# Patient Record
Sex: Female | Born: 1990 | Race: White | Hispanic: No | Marital: Single | Smoking: Never smoker
Health system: Southern US, Community
[De-identification: ages and names within clinical notes are randomized; demographics above are authoritative.]

## PROBLEM LIST (undated history)

## (undated) DIAGNOSIS — N920 Excessive and frequent menstruation with regular cycle: Secondary | ICD-10-CM

## (undated) DIAGNOSIS — J45909 Unspecified asthma, uncomplicated: Secondary | ICD-10-CM

## (undated) DIAGNOSIS — N200 Calculus of kidney: Secondary | ICD-10-CM

## (undated) HISTORY — DX: Excessive and frequent menstruation with regular cycle: N92.0

## (undated) HISTORY — PX: FRACTURE SURGERY: SHX138

## (undated) HISTORY — DX: Unspecified asthma, uncomplicated: J45.909

## (undated) HISTORY — DX: Calculus of kidney: N20.0

---

## 2016-10-21 ENCOUNTER — Other Ambulatory Visit: Payer: Self-pay | Admitting: Obstetrics and Gynecology

## 2016-10-21 DIAGNOSIS — Z309 Encounter for contraceptive management, unspecified: Secondary | ICD-10-CM

## 2016-10-29 ENCOUNTER — Ambulatory Visit: Payer: BC Managed Care – PPO | Admitting: Obstetrics and Gynecology

## 2016-12-12 ENCOUNTER — Ambulatory Visit: Payer: BC Managed Care – PPO | Admitting: Obstetrics and Gynecology

## 2017-01-10 ENCOUNTER — Ambulatory Visit (INDEPENDENT_AMBULATORY_CARE_PROVIDER_SITE_OTHER): Payer: BC Managed Care – PPO

## 2017-01-10 ENCOUNTER — Encounter: Payer: Self-pay | Admitting: Gynecology

## 2017-01-10 ENCOUNTER — Ambulatory Visit
Admission: EM | Admit: 2017-01-10 | Discharge: 2017-01-10 | Disposition: A | Discharge status: Home / Self Care | Payer: BC Managed Care – PPO | Attending: Internal Medicine | Admitting: Internal Medicine

## 2017-01-10 DIAGNOSIS — S93602A Unspecified sprain of left foot, initial encounter: Secondary | ICD-10-CM

## 2017-01-10 DIAGNOSIS — R030 Elevated blood-pressure reading, without diagnosis of hypertension: Secondary | ICD-10-CM | POA: Diagnosis not present

## 2017-01-10 DIAGNOSIS — M79672 Pain in left foot: Secondary | ICD-10-CM

## 2017-01-10 MED ORDER — ACETAMINOPHEN 325 MG PO TABS
650.0000 mg | ORAL_TABLET | Freq: Once | ORAL | Status: AC
  Administered 2017-01-10: 650 mg via ORAL

## 2017-01-10 NOTE — ED Provider Notes (Signed)
CSN: [id number]     Arrival date & time 01/10/17  1605 History   None    Chief Complaint  Patient presents with  . Foot Pain   (Consider location/radiation/quality/duration/timing/severity/associated sxs/prior Treatment) 26 yr old obese caucasian female presents to UC cc of left foot pain, states no known injury however did "stepped wrong on stairs last night". Tried ibuprofen this am, hurts to bear wt. On feet a lot doing testing at school.    The history is provided by the patient. No language interpreter was used.    Past Medical History:  Diagnosis Date  . Asthma   . Kidney stone   . Menorrhagia    History reviewed. No pertinent surgical history. Family History  Problem Relation Age of Onset  . Melanoma Father    Social History  Substance Use Topics  . Smoking status: Never Smoker  . Smokeless tobacco: Never Used  . Alcohol use Yes   OB History    Gravida Para Term Preterm AB Living   0 0 0 0 0 0   SAB TAB Ectopic Multiple Live Births   0 0 0 0 0     Review of Systems  Constitutional: Positive for activity change. Negative for fever.  HENT: Negative.   Eyes: Negative.   Respiratory: Negative for shortness of breath.   Cardiovascular: Negative for chest pain.  Gastrointestinal: Negative for abdominal pain.  Endocrine: Negative.   Genitourinary: Negative.   Musculoskeletal: Positive for gait problem. Negative for back pain.  Skin: Negative for color change and wound.  Neurological: Negative for headaches.  Hematological: Negative.   Psychiatric/Behavioral: Negative.   All other systems reviewed and are negative.   Allergies  Patient has no known allergies.  Home Medications   Prior to Admission medications   Medication Sig Start Date End Date Taking? Authorizing Provider  SPRINTEC 28 0.25-35 MG-MCG tablet TAKE 1 TABLET BY MOUTH EVERY DAY 10/21/16  Yes Vena Austria, MD   Meds Ordered and Administered this Visit   Medications  acetaminophen  (TYLENOL) tablet 650 mg (650 mg Oral Given 01/10/17 1746)    BP (!) 152/99 (BP Location: Left Arm)   Pulse 93   Temp 98.4 F (36.9 C) (Oral)   Resp 18   Ht 5\' 8"  (1.727 m)   Wt 298 lb (135.2 kg)   LMP 12/23/2016   SpO2 100%   BMI 45.31 kg/m  No data found.   Physical Exam  Constitutional: She is oriented to person, place, and time. She appears well-developed and well-nourished. She is active and cooperative.  Non-toxic appearance. She does not have a sickly appearance. She does not appear ill. No distress.  HENT:  Head: Normocephalic.  Right Ear: Tympanic membrane normal.  Left Ear: Tympanic membrane normal.  Nose: Nose normal.  Mouth/Throat: Uvula is midline and mucous membranes are normal.  Eyes: Pupils are equal, round, and reactive to light.  Neck: Trachea normal and normal range of motion. Muscular tenderness present. No Brudzinski's sign and no Kernig's sign noted.  Cardiovascular: Normal rate, regular rhythm, intact distal pulses and normal pulses.   Pulses:      Dorsalis pedis pulses are 2+ on the right side, and 2+ on the left side.  Pulmonary/Chest: Effort normal and breath sounds normal.  Musculoskeletal:       Left foot: There is tenderness and bony tenderness. There is normal range of motion, no swelling, normal capillary refill, no crepitus, no deformity and no laceration.  Feet:  Neurological: She is alert and oriented to person, place, and time. She has normal strength. No cranial nerve deficit or sensory deficit. Gait abnormal. GCS eye subscore is 4. GCS verbal subscore is 5. GCS motor subscore is 6.  Pt walks on tip toes d/t pain  Skin: Skin is warm and dry. No rash noted.  Psychiatric: She has a normal mood and affect. Her speech is normal and behavior is normal.    Urgent Care Course     Procedures (including critical care time)  Labs Review Labs Reviewed - No data to display  Imaging Review Dg Foot Complete Left  Result Date:  01/10/2017 CLINICAL DATA:  Lateral foot pain EXAM: LEFT FOOT - COMPLETE 3+ VIEW COMPARISON:  None. FINDINGS: There is no evidence of fracture or dislocation. There is no evidence of arthropathy or other focal bone abnormality. Soft tissues are unremarkable. IMPRESSION: No acute abnormality noted. Electronically Signed   By: Alcide CleverMark  Lukens M.D.   On: 01/10/2017 17:50          MDM   1. Foot pain, left   2. Foot sprain, left, initial encounter   3. Elevated blood pressure reading     Ace wrap and crutches provided in office with instructions. Your xray was negative for fracture. Rest,ice,elevate, wear ace wrap, use crutches. May take ibuprfoen 600mg  po every 8 hours for pain/inflammation and tylenol 650 mg po every 6 hours for pain. Please get established with PCP of your choice or follow up with Dr. Yvette RackPlonk-call for appt. Return to UC for new or worsening issues. Pt verbalized understanding to this provider.    Clancy Gourdefelice, Valia Wingard, NP 01/10/17 1900

## 2017-01-10 NOTE — ED Triage Notes (Signed)
Patient c/o left foot pain x yesterday. Per patient deny any injury to foot.

## 2017-01-10 NOTE — Discharge Instructions (Addendum)
Your xray was negative for fracture. Rst,ice,elevate, wear ace wrap, use crutches. May take ibuprfoen 600mg  po every 8 hours for pain/inflammation and tylenol 650 mg po every 6 hours for pain. Please get established with PCP of your choice or follow up with Dr. Yvette RackPlonk-call for appt. Need to have bp rechecked in 2-3 days as it was elevated in UC today. Return to UC for new or worsening issues

## 2018-10-30 IMAGING — CR DG FOOT COMPLETE 3+V*L*
3 series · 3 of 3 positions shown · non-contrast
Comparison: None.

CLINICAL DATA: Lateral foot pain

EXAM:
LEFT FOOT - COMPLETE 3+ VIEW

[foot ap]
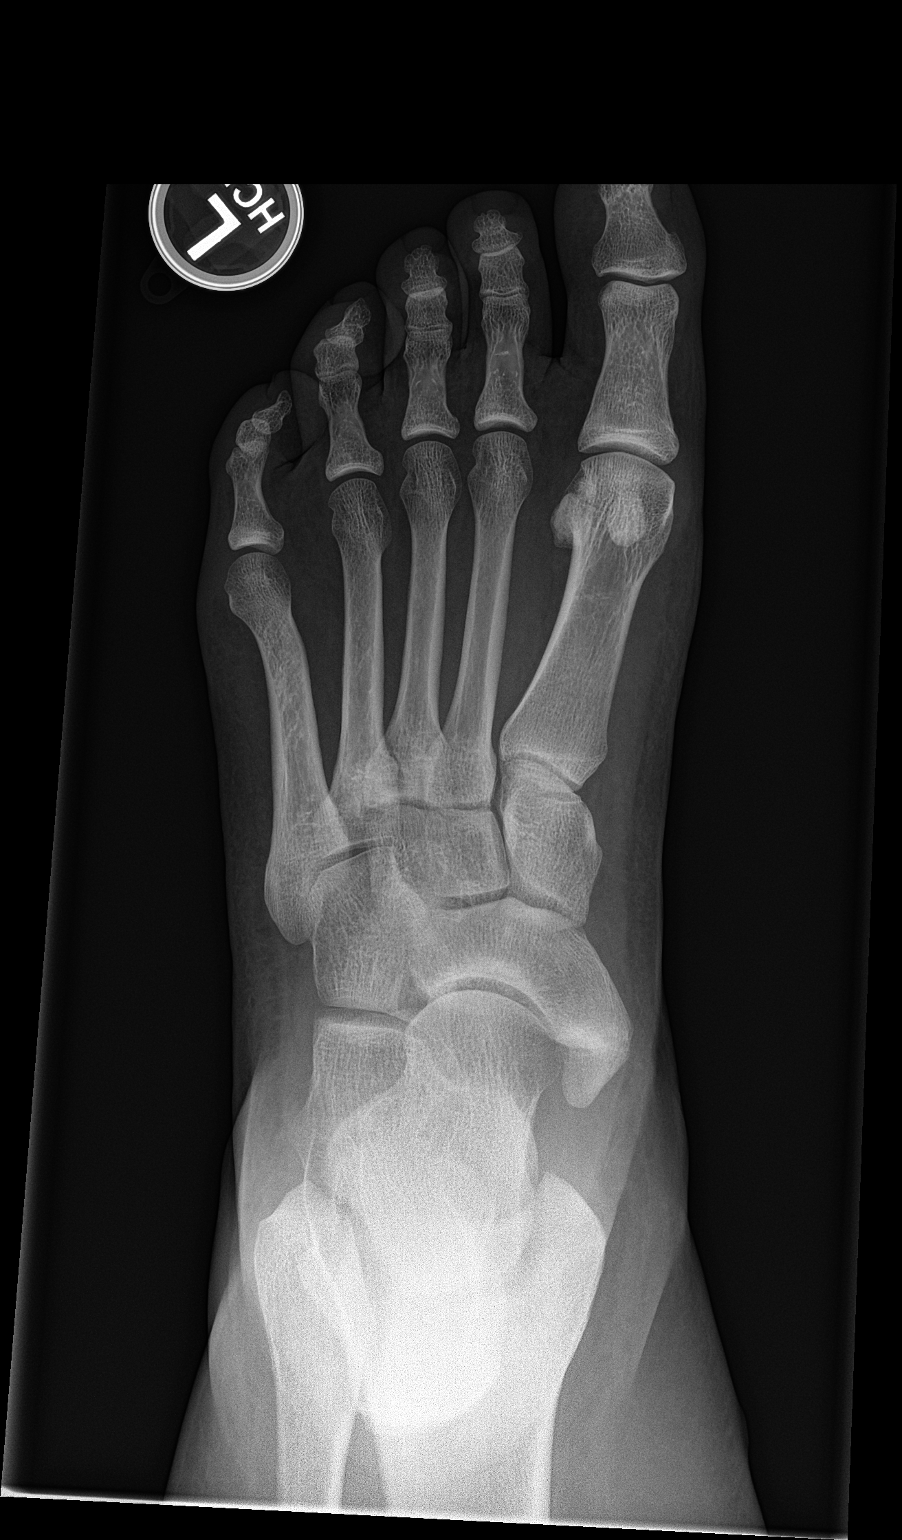

[foot obl]
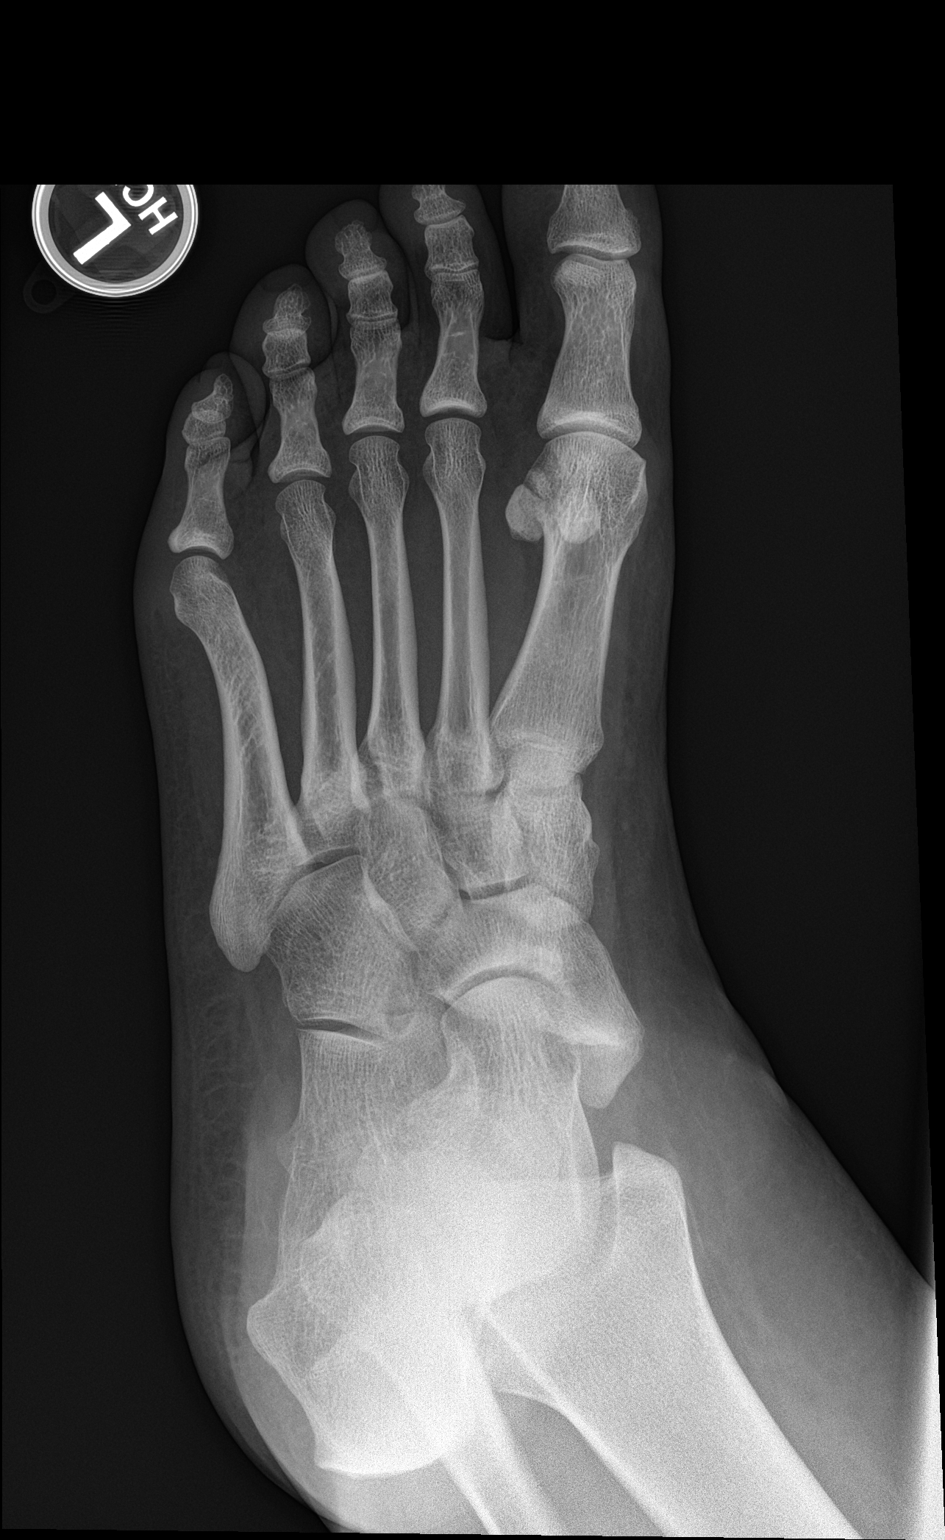

[foot lat]
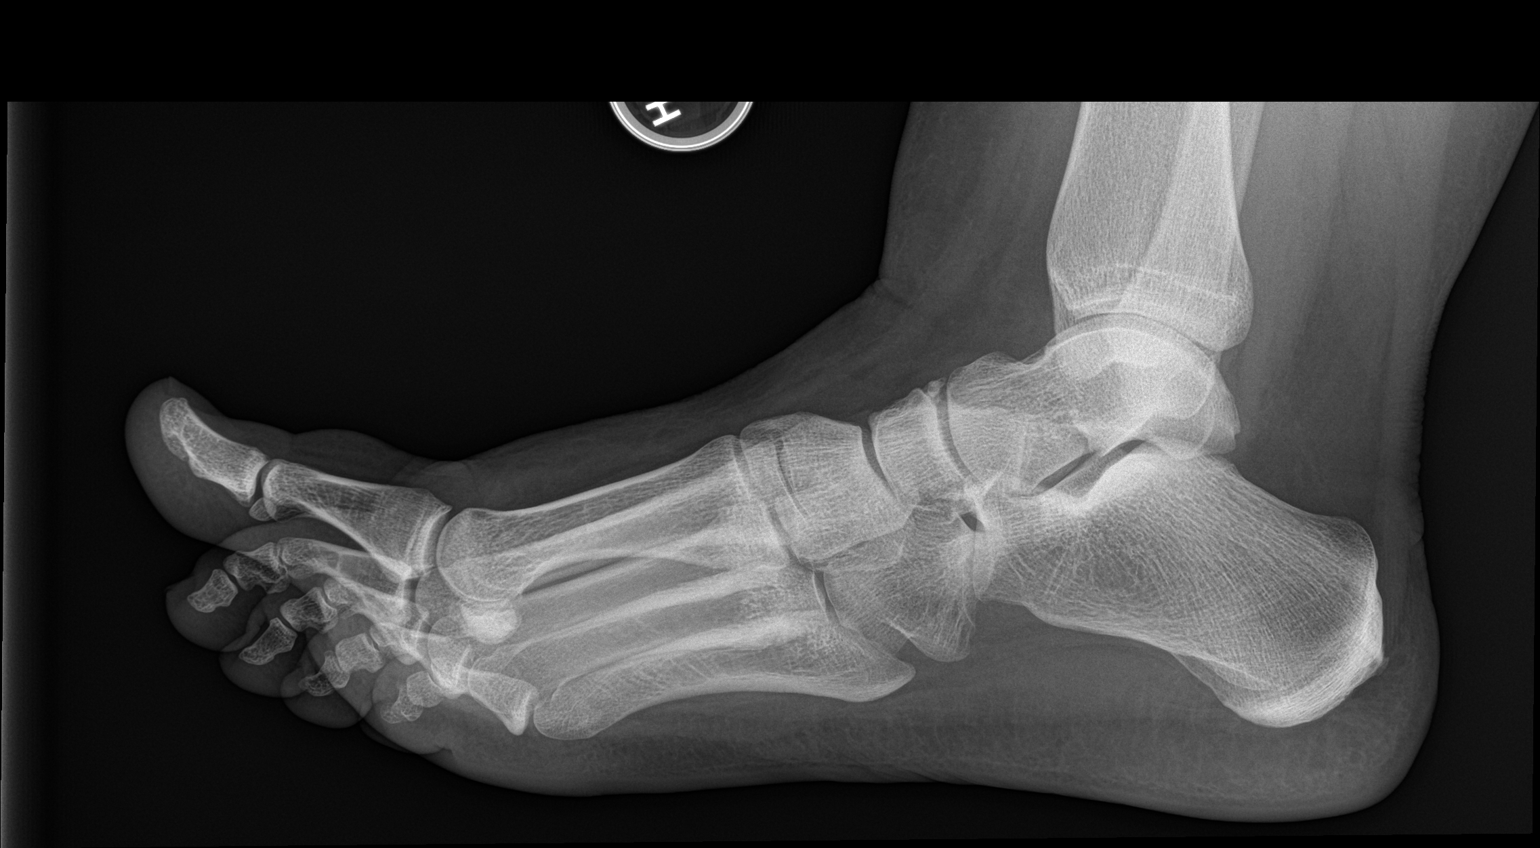

[3 of 3 positions shown; findings below may reference images not displayed]

FINDINGS: There is no evidence of fracture or dislocation. There is no
evidence of arthropathy or other focal bone abnormality. Soft
tissues are unremarkable.
IMPRESSION: No acute abnormality noted.

## 2024-04-10 ENCOUNTER — Emergency Department
Admission: EM | Admit: 2024-04-10 | Discharge: 2024-04-10 | Disposition: A | Discharge status: Home / Self Care | Attending: Emergency Medicine | Admitting: Emergency Medicine

## 2024-04-10 ENCOUNTER — Encounter: Payer: Self-pay | Admitting: Emergency Medicine

## 2024-04-10 ENCOUNTER — Other Ambulatory Visit: Payer: Self-pay

## 2024-04-10 ENCOUNTER — Emergency Department

## 2024-04-10 ENCOUNTER — Ambulatory Visit
Admission: EM | Admit: 2024-04-10 | Discharge: 2024-04-10 | Disposition: A | Discharge status: Home / Self Care | Source: Home / Self Care

## 2024-04-10 DIAGNOSIS — R1013 Epigastric pain: Secondary | ICD-10-CM | POA: Insufficient documentation

## 2024-04-10 DIAGNOSIS — R197 Diarrhea, unspecified: Secondary | ICD-10-CM | POA: Diagnosis not present

## 2024-04-10 DIAGNOSIS — R112 Nausea with vomiting, unspecified: Secondary | ICD-10-CM | POA: Insufficient documentation

## 2024-04-10 DIAGNOSIS — J45909 Unspecified asthma, uncomplicated: Secondary | ICD-10-CM | POA: Diagnosis not present

## 2024-04-10 DIAGNOSIS — R1010 Upper abdominal pain, unspecified: Secondary | ICD-10-CM | POA: Diagnosis not present

## 2024-04-10 LAB — COMPREHENSIVE METABOLIC PANEL WITH GFR
ALT: 20 U/L (ref 0–44)
AST: 36 U/L (ref 15–41)
Albumin: 3.9 g/dL (ref 3.5–5.0)
Alkaline Phosphatase: 64 U/L (ref 38–126)
Anion gap: 10 (ref 5–15)
BUN: 11 mg/dL (ref 6–20)
CO2: 24 mmol/L (ref 22–32)
Calcium: 9 mg/dL (ref 8.9–10.3)
Chloride: 100 mmol/L (ref 98–111)
Creatinine, Ser: 0.65 mg/dL (ref 0.44–1.00)
GFR, Estimated: >60 mL/min (ref >60)
Glucose, Bld: 215 mg/dL — ABNORMAL HIGH (ref 70–99)
Potassium: 4.2 mmol/L (ref 3.5–5.1)
Sodium: 134 mmol/L — ABNORMAL LOW (ref 135–145)
Total Bilirubin: 0.6 mg/dL (ref 0.0–1.2)
Total Protein: 8.2 g/dL — ABNORMAL HIGH (ref 6.5–8.1)

## 2024-04-10 LAB — URINALYSIS, W/ REFLEX TO CULTURE (INFECTION SUSPECTED)
Bilirubin Urine: NEGATIVE
Glucose, UA: 100 mg/dL — AB
Hgb urine dipstick: NEGATIVE
Leukocytes,Ua: NEGATIVE
Nitrite: NEGATIVE
Protein, ur: 30 mg/dL — AB
RBC / HPF: NONE SEEN RBC/hpf (ref 0–5)
Specific Gravity, Urine: 1.025 (ref 1.005–1.030)
WBC, UA: NONE SEEN WBC/hpf (ref 0–5)
pH: 6 (ref 5.0–8.0)

## 2024-04-10 LAB — POC URINE PREG, ED: Preg Test, Ur: NEGATIVE

## 2024-04-10 LAB — CBC
HCT: 44.2 % (ref 36.0–46.0)
Hemoglobin: 14.4 g/dL (ref 12.0–15.0)
MCH: 27.4 pg (ref 26.0–34.0)
MCHC: 32.6 g/dL (ref 30.0–36.0)
MCV: 84 fL (ref 80.0–100.0)
Platelets: 319 K/uL (ref 150–400)
RBC: 5.26 MIL/uL — ABNORMAL HIGH (ref 3.87–5.11)
RDW: 13.3 % (ref 11.5–15.5)
WBC: 12.4 K/uL — ABNORMAL HIGH (ref 4.0–10.5)
nRBC: 0 % (ref 0.0–0.2)

## 2024-04-10 LAB — URINALYSIS, ROUTINE W REFLEX MICROSCOPIC
Bacteria, UA: NONE SEEN
Bilirubin Urine: NEGATIVE
Glucose, UA: 50 mg/dL — AB
Hgb urine dipstick: NEGATIVE
Ketones, ur: 5 mg/dL — AB
Leukocytes,Ua: NEGATIVE
Nitrite: NEGATIVE
Protein, ur: 30 mg/dL — AB
Specific Gravity, Urine: 1.024 (ref 1.005–1.030)
pH: 5 (ref 5.0–8.0)

## 2024-04-10 LAB — LIPASE, BLOOD: Lipase: 30 U/L (ref 11–51)

## 2024-04-10 LAB — PREGNANCY, URINE: Preg Test, Ur: NEGATIVE

## 2024-04-10 MED ORDER — HYDROCODONE-ACETAMINOPHEN 5-325 MG PO TABS
1.0000 | ORAL_TABLET | Freq: Once | ORAL | Status: AC
  Administered 2024-04-10: 1 via ORAL
  Filled 2024-04-10: qty 1

## 2024-04-10 MED ORDER — ONDANSETRON 4 MG PO TBDP
4.0000 mg | ORAL_TABLET | Freq: Once | ORAL | Status: AC
  Administered 2024-04-10: 4 mg via ORAL
  Filled 2024-04-10: qty 1

## 2024-04-10 MED ORDER — ALUM & MAG HYDROXIDE-SIMETH 200-200-20 MG/5ML PO SUSP
30.0000 mL | Freq: Once | ORAL | Status: AC
  Administered 2024-04-10: 30 mL via ORAL

## 2024-04-10 MED ORDER — HYDROCODONE-ACETAMINOPHEN 5-325 MG PO TABS
1.0000 | ORAL_TABLET | Freq: Four times a day (QID) | ORAL | 0 refills | Status: AC | PRN
Start: 2024-04-10 — End: 2024-04-13

## 2024-04-10 MED ORDER — ONDANSETRON 4 MG PO TBDP
4.0000 mg | ORAL_TABLET | Freq: Once | ORAL | Status: AC
  Administered 2024-04-10: 4 mg via ORAL

## 2024-04-10 MED ORDER — FAMOTIDINE 40 MG PO TABS: 40.0000 mg | ORAL_TABLET | Freq: Every day | ORAL | 1 refills | Status: AC

## 2024-04-10 MED ORDER — LIDOCAINE VISCOUS HCL 2 % MT SOLN
15.0000 mL | Freq: Once | OROMUCOSAL | Status: AC
  Administered 2024-04-10: 15 mL via OROMUCOSAL

## 2024-04-10 MED ORDER — FAMOTIDINE 20 MG PO TABS
20.0000 mg | ORAL_TABLET | Freq: Every day | ORAL | Status: DC
  Administered 2024-04-10: 20 mg via ORAL

## 2024-04-10 MED ORDER — ONDANSETRON 4 MG PO TBDP: 4.0000 mg | ORAL_TABLET | Freq: Three times a day (TID) | ORAL | 0 refills | Status: AC | PRN

## 2024-04-10 NOTE — ED Triage Notes (Signed)
 Pt arrives with c/o ABD pain that started yesterday. Pt reports n/v/d. Pt denies fevers.

## 2024-04-10 NOTE — ED Provider Notes (Signed)
 [facility] Provider Note    Event Date/Time   First MD Initiated Contact with Patient 04/10/24 1717     (approximate)   History   Abdominal Pain   HPI  Nicole Weber is a 33 y.o. female with PMH of asthma, menorrhagia and kidney stones who presents for evaluation of epigastric abdominal pain that began Thursday evening.  Patient reports that the abdominal pain is constant with periods of sharp increases in her pain.  She describes it as a stabbing pain that radiates throughout her abdomen.  She endorses nausea and vomiting as well as some diarrhea.  She has not had any fevers or urinary symptoms.  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  Was seen at urgent care prior to coming to the ED and was given medication for gastritis.  On the way home from urgent care she felt like her symptoms were getting worse so she came to the emergency department.      Physical Exam   Triage Vital Signs: ED Triage Vitals [04/10/24 1653]  Encounter Vitals Group     BP (!) 167/97     Girls Systolic BP Percentile      Girls Diastolic BP Percentile      Boys Systolic BP Percentile      Boys Diastolic BP Percentile      Pulse Rate (!) 104     Resp 16     Temp 98.3 F (36.8 C)     Temp src      SpO2 99 %     Weight      Height      Head Circumference      Peak Flow      Pain Score 4     Pain Loc      Pain Education      Exclude from Growth Chart     Most recent vital signs: Vitals:   04/10/24 1724 04/10/24 1843  BP:  (!) 145/92  Pulse:  88  Resp:  18  Temp:    SpO2: 100% 100%   General: Awake, no distress.  CV:  Good peripheral perfusion.  RRR. Resp:  Normal effort.  CTAB. Abd:  No distention.  Soft, with more tenderness to palpation across the upper abdomen in the lower abdomen but mildly tender throughout.  Other:     ED Results / Procedures / Treatments   Labs (all labs ordered are listed, but only abnormal  results are displayed) Labs Reviewed  COMPREHENSIVE METABOLIC PANEL WITH GFR - Abnormal; Notable for the following components:      Result Value   Sodium 134 (*)    Glucose, Bld 215 (*)    Total Protein 8.2 (*)    All other components within normal limits  CBC - Abnormal; Notable for the following components:   WBC 12.4 (*)    RBC 5.26 (*)    All other components within normal limits  URINALYSIS, ROUTINE W REFLEX MICROSCOPIC - Abnormal; Notable for the following components:   Color, Urine YELLOW (*)    APPearance HAZY (*)    Glucose, UA 50 (*)    Ketones, ur 5 (*)    Protein, ur 30 (*)    All other components within normal limits  LIPASE, BLOOD  POC URINE PREG, ED    RADIOLOGY  Right upper quadrant ultrasound obtained, interpreted the images as well as reviewed the radiologist ports was negative for gallstones but  did show hepatic steatosis.  PROCEDURES:  Critical Care performed: No  Procedures   MEDICATIONS ORDERED IN ED: Medications  ondansetron  (ZOFRAN -ODT) disintegrating tablet 4 mg (4 mg Oral Given 04/10/24 1954)  HYDROcodone -acetaminophen  (NORCO/VICODIN) 5-325 MG per tablet 1 tablet (1 tablet Oral Given 04/10/24 1953)     IMPRESSION / MDM / ASSESSMENT AND PLAN / ED COURSE  I reviewed the triage vital signs and the nursing notes.                             33 year old female presents for evaluation of abdominal pain.  Blood pressure and heart rate are elevated upon presentation but patient does appear quite uncomfortable on exam.  Vital signs are stable otherwise.  Differential diagnosis includes, but is not limited to, gastritis, gastroenteritis, reflux, peptic ulcer, pancreatitis, biliary disease.  Patient's presentation is most consistent with acute complicated illness / injury requiring diagnostic workup.  Will obtain basic labs and urinalysis.  May consider imaging based on this.  Patient did have tenderness to palpation in the right upper quadrant so we  will likely start with right upper quadrant ultrasound.  Patient was agreeable to this.  Offered patient pain medication at the time of my initial assessment and she stated that she was comfortable at this time.  Clinical Course as of 04/10/24 2040  Sat Apr 10, 2024  1724 CBC(!) Mild leukocytosis. [LD]  1724 Comprehensive metabolic panel(!) Elevated glucose, otherwise unremarkable. [LD]  1724 Lipase, blood Within normal limits, low suspicion for pancreatitis. [LD]  1725 Urinalysis, Routine w reflex microscopic -Urine, Clean Catch(!) Positive for glucose, ketones and protein. Patient does have history of diabetes, suspect ketones is due to decreased oral intake due to her vomiting. [LD]  1857 US  Abdomen Limited RUQ (LIVER/GB) Hepatic steatosis, no gall stones. [LD]  1920 Will p.o. challenge patient.  Give her some nausea medication and then see if she can tolerate fluids and solid food.  If she is able to keep this down do not see a need to do a CT scan as my suspicion for a bowel obstruction is very low.  Patient reports she is still having bowel movements.  She is reporting some pain so we will give her a dose of pain medication at this time as well. [LD]  2038 Nursing staff notified me that patient was able to tolerate both soda and crackers.  Will get her discharged home.  She can take the GI medications prescribed by the urgent care provider gave return precautions.  She voiced understanding, all questions were answered and she is stable at discharge. [LD]    Clinical Course User Index [LD] Cleaster Tinnie LABOR, PA-C     FINAL CLINICAL IMPRESSION(S) / ED DIAGNOSES   Final diagnoses:  Pain of upper abdomen     Rx / DC Orders   ED Discharge Orders     None        Note:  This document was prepared using Dragon voice recognition software and may include unintentional dictation errors.   Cleaster Tinnie LABOR, PA-C 04/10/24 2040    Ernest Ronal BRAVO, MD 04/11/24 1104

## 2024-04-10 NOTE — Discharge Instructions (Addendum)
  1. Epigastric pain (Primary) - alum & mag hydroxide-simeth (MAALOX/MYLANTA) 200-200-20 MG/5ML suspension 30 mL and lidocaine  (XYLOCAINE ) 2 % viscous mouth solution 15 mL given in UC for acute gastritis - famotidine  (PEPCID ) tablet 20 mg given in UC for gastritis - famotidine  (PEPCID ) 40 MG tablet; Take 1 tablet (40 mg total) by mouth daily.  Dispense: 30 tablet; Refill: 1 - Ambulatory referral to Gastroenterology for follow-up evaluation and possible need for endoscopy for possible peptic ulcer  2. Nausea and vomiting, unspecified vomiting type - Pregnancy, urine complete and UC is negative - Urinalysis, w/ Reflex to Culture (Infection Suspected) -Urine, Clean Catch complaining UC shows trace leukocytes, mild blood, no nitrite, these findings are not significant for urinary tract infection as cause of symptoms. - ondansetron  (ZOFRAN -ODT) disintegrating tablet 4 mg given in UC for acute nausea - ondansetron  (ZOFRAN -ODT) 4 MG disintegrating tablet; Take 1 tablet (4 mg total) by mouth every 8 (eight) hours as needed for nausea or vomiting.  Dispense: 20 tablet; Refill: 0 -Continue to monitor symptoms for any change in severity if there is any escalation of current symptoms or development of new symptoms follow-up in ER for further evaluation and management.

## 2024-04-10 NOTE — ED Triage Notes (Signed)
 Patient states that she woke up yesterday morning and started vomiting.  Patient states that later the day she felt better.  Patient states that she woke up this morning and states that she can only keep liquids down.  Patient states today she has had severe mid upper abdominal pain.  Patient states that the pain comes and goes.  Patient reports some diarrhea.  Patient unsure of fevers.

## 2024-04-10 NOTE — Discharge Instructions (Addendum)
 Your blood work showed a very mildly elevated white blood cell count.  This is normal with a viral illness.  Your metabolic panel was normal aside from an elevated blood sugar.  Your lipase which checks for pancreatitis was normal.  Your urinalysis had some glucose, ketones and protein in it.  The glucose is likely from your diabetes for the protein and ketones is due to dehydration from your vomiting.  The ultrasound showed hepatic steatosis which is when there is fat buildup in the liver.  This likely is not the cause of your pain but was just an incidental finding.  We did not see any gallstones or other gallbladder abnormalities.  Believe your abdominal pain and vomiting is the result of a viral infection.  It may be due to some inflammation within your stomach.  You can take the medications prescribed by the urgent care provider.  The Pepcid  will help reduce acid buildup in your stomach over time.  The Zofran  is a nausea medication that can be taken every 8 hours.  This is the same medication you are given in the emergency department.  I recommend the eating bland foods and taking small sips of water.    Because of your reassuring lab work and ability to tolerate fluids and food we did not get a CT scan today.  This can be done in the future if your abdominal pain worsens.  Please return to the emergency department with any worsening symptoms.

## 2024-04-10 NOTE — ED Provider Notes (Signed)
 [facility]  Note:  This document was prepared using Conservation officer, historic buildings and may include unintentional dictation errors.  MRN: [id number] DOB: 10-Mar-1991  Subjective:   Nicole Weber is a 33 y.o. female presenting for severe intermittent abdominal pain since yesterday with new onset vomiting that started this morning.  Patient reports she woke up yesterday morning with abdominal pain from epigastric area radiating around abdomen that had subsided by midmorning and patient was feeling better.  Patient reports that overnight last night she woke up with severe abdominal pain again and began vomiting.  Patient denies taking any over-the-counter medication to treat symptoms.  Patient states that symptoms come and go but has never had any symptoms similar to this in the past.  Patient reports she has some mild diarrhea but no fever.  She states that there is always a mild ache to her stomach but occasionally has a very sharp burning pain that occurs and then radiates around her entire abdomen.   Current Facility-Administered Medications:    famotidine  (PEPCID ) tablet 20 mg, 20 mg, Oral, Daily, Thuan Tippett B, NP, 20 mg at 04/10/24 1550  Current Outpatient Medications:    famotidine  (PEPCID ) 40 MG tablet, Take 1 tablet (40 mg total) by mouth daily., Disp: 30 tablet, Rfl: 1   ondansetron  (ZOFRAN -ODT) 4 MG disintegrating tablet, Take 1 tablet (4 mg total) by mouth every 8 (eight) hours as needed for nausea or vomiting., Disp: 20 tablet, Rfl: 0   SPRINTEC 28 0.25-35 MG-MCG tablet, TAKE 1 TABLET BY MOUTH EVERY DAY, Disp: 84 tablet, Rfl: 0   Allergies  Allergen Reactions   Nickel Rash    Past Medical History:  Diagnosis Date   Asthma    Kidney stone    Menorrhagia      Past Surgical History:  Procedure Laterality Date   FRACTURE SURGERY      Family History  Problem Relation Age of Onset   Melanoma Father     Social History   Tobacco Use   Smoking status:  Never   Smokeless tobacco: Never  Vaping Use   Vaping status: Never Used  Substance Use Topics   Alcohol use: Yes   Drug use: No    ROS Refer to HPI for ROS details.  Objective:   Vitals: BP (!) 161/100 (BP Location: Right Arm)   Pulse 100   Temp 98.4 F (36.9 C) (Oral)   Resp 14   Ht 5' 8 (1.727 m)   Wt 298 lb 1 oz (135.2 kg)   SpO2 98%   BMI 45.32 kg/m   Physical Exam Vitals and nursing note reviewed.  Constitutional:      General: She is not in acute distress.    Appearance: She is well-developed. She is not ill-appearing or toxic-appearing.  HENT:     Head: Normocephalic and atraumatic.     Mouth/Throat:     Mouth: Mucous membranes are moist.  Cardiovascular:     Rate and Rhythm: Normal rate.  Pulmonary:     Effort: Pulmonary effort is normal. No respiratory distress.  Abdominal:     General: There is no distension.     Palpations: Abdomen is soft.     Tenderness: There is abdominal tenderness in the epigastric area. There is no right CVA tenderness, left CVA tenderness, guarding or rebound.  Skin:    General: Skin is warm and dry.  Neurological:     General: No focal deficit present.     Mental Status: She  is alert and oriented to person, place, and time.  Psychiatric:        Mood and Affect: Mood normal.        Behavior: Behavior normal.     Procedures  Results for orders placed or performed during the hospital encounter of 04/10/24 (from the past 24 hours)  Pregnancy, urine     Status: None   Collection Time: 04/10/24  3:17 PM  Result Value Ref Range   Preg Test, Ur NEGATIVE NEGATIVE  Urinalysis, w/ Reflex to Culture (Infection Suspected) -Urine, Clean Catch     Status: Abnormal   Collection Time: 04/10/24  3:17 PM  Result Value Ref Range   Specimen Source URINE, CLEAN CATCH    Color, Urine YELLOW YELLOW   APPearance CLEAR CLEAR   Specific Gravity, Urine 1.025 1.005 - 1.030   pH 6.0 5.0 - 8.0   Glucose, UA 100 (A) NEGATIVE mg/dL   Hgb  urine dipstick NEGATIVE NEGATIVE   Bilirubin Urine NEGATIVE NEGATIVE   Ketones, ur TRACE (A) NEGATIVE mg/dL   Protein, ur 30 (A) NEGATIVE mg/dL   Nitrite NEGATIVE NEGATIVE   Leukocytes,Ua NEGATIVE NEGATIVE   Squamous Epithelial / HPF 11-20 0 - 5 /HPF   WBC, UA NONE SEEN 0 - 5 WBC/hpf   RBC / HPF NONE SEEN 0 - 5 RBC/hpf   Bacteria, UA FEW (A) NONE SEEN    Assessment and Plan :     Discharge Instructions       1. Epigastric pain (Primary) - alum & mag hydroxide-simeth (MAALOX/MYLANTA) 200-200-20 MG/5ML suspension 30 mL and lidocaine  (XYLOCAINE ) 2 % viscous mouth solution 15 mL given in UC for acute gastritis - famotidine  (PEPCID ) tablet 20 mg given in UC for gastritis - famotidine  (PEPCID ) 40 MG tablet; Take 1 tablet (40 mg total) by mouth daily.  Dispense: 30 tablet; Refill: 1 - Ambulatory referral to Gastroenterology for follow-up evaluation and possible need for endoscopy for possible peptic ulcer  2. Nausea and vomiting, unspecified vomiting type - Pregnancy, urine complete and UC is negative - Urinalysis, w/ Reflex to Culture (Infection Suspected) -Urine, Clean Catch complaining UC shows trace leukocytes, mild blood, no nitrite, these findings are not significant for urinary tract infection as cause of symptoms. - ondansetron  (ZOFRAN -ODT) disintegrating tablet 4 mg given in UC for acute nausea - ondansetron  (ZOFRAN -ODT) 4 MG disintegrating tablet; Take 1 tablet (4 mg total) by mouth every 8 (eight) hours as needed for nausea or vomiting.  Dispense: 20 tablet; Refill: 0 -Continue to monitor symptoms for any change in severity if there is any escalation of current symptoms or development of new symptoms follow-up in ER for further evaluation and management.     Crosley Stejskal B Liyana Suniga   Aislin Onofre, [place], [place] 04/10/24 [phone]

## 2024-04-14 ENCOUNTER — Emergency Department
Admission: EM | Admit: 2024-04-14 | Discharge: 2024-04-14 | Disposition: A | Discharge status: Home / Self Care | Attending: Emergency Medicine | Admitting: Emergency Medicine

## 2024-04-14 ENCOUNTER — Other Ambulatory Visit: Payer: Self-pay

## 2024-04-14 DIAGNOSIS — R109 Unspecified abdominal pain: Secondary | ICD-10-CM | POA: Diagnosis not present

## 2024-04-14 DIAGNOSIS — R197 Diarrhea, unspecified: Secondary | ICD-10-CM | POA: Insufficient documentation

## 2024-04-14 DIAGNOSIS — R112 Nausea with vomiting, unspecified: Secondary | ICD-10-CM | POA: Diagnosis not present

## 2024-04-14 LAB — GASTROINTESTINAL PANEL BY PCR, STOOL (REPLACES STOOL CULTURE)

## 2024-04-14 LAB — URINALYSIS, ROUTINE W REFLEX MICROSCOPIC
Bilirubin Urine: NEGATIVE
Glucose, UA: 50 mg/dL — AB
Hgb urine dipstick: NEGATIVE
Ketones, ur: 20 mg/dL — AB
Leukocytes,Ua: NEGATIVE
Nitrite: NEGATIVE
Protein, ur: NEGATIVE mg/dL
Specific Gravity, Urine: 1.015 (ref 1.005–1.030)
pH: 5 (ref 5.0–8.0)

## 2024-04-14 LAB — CBC
HCT: 43.4 % (ref 36.0–46.0)
Hemoglobin: 14.3 g/dL (ref 12.0–15.0)
MCH: 27.2 pg (ref 26.0–34.0)
MCHC: 32.9 g/dL (ref 30.0–36.0)
MCV: 82.7 fL (ref 80.0–100.0)
Platelets: 298 K/uL (ref 150–400)
RBC: 5.25 MIL/uL — ABNORMAL HIGH (ref 3.87–5.11)
RDW: 13.2 % (ref 11.5–15.5)
WBC: 9.1 K/uL (ref 4.0–10.5)
nRBC: 0 % (ref 0.0–0.2)

## 2024-04-14 LAB — COMPREHENSIVE METABOLIC PANEL WITH GFR
ALT: 37 U/L (ref 0–44)
AST: 85 U/L — ABNORMAL HIGH (ref 15–41)
Albumin: 4.2 g/dL (ref 3.5–5.0)
Alkaline Phosphatase: 62 U/L (ref 38–126)
Anion gap: 14 (ref 5–15)
BUN: 9 mg/dL (ref 6–20)
CO2: 21 mmol/L — ABNORMAL LOW (ref 22–32)
Calcium: 9.4 mg/dL (ref 8.9–10.3)
Chloride: 99 mmol/L (ref 98–111)
Creatinine, Ser: 0.81 mg/dL (ref 0.44–1.00)
GFR, Estimated: >60 mL/min (ref >60)
Glucose, Bld: 209 mg/dL — ABNORMAL HIGH (ref 70–99)
Potassium: 4.2 mmol/L (ref 3.5–5.1)
Sodium: 134 mmol/L — ABNORMAL LOW (ref 135–145)
Total Bilirubin: 0.8 mg/dL (ref 0.0–1.2)
Total Protein: 8 g/dL (ref 6.5–8.1)

## 2024-04-14 LAB — LIPASE, BLOOD: Lipase: 31 U/L (ref 11–51)

## 2024-04-14 LAB — POC URINE PREG, ED: Preg Test, Ur: NEGATIVE

## 2024-04-14 MED ORDER — ONDANSETRON 4 MG PO TBDP
4.0000 mg | ORAL_TABLET | Freq: Three times a day (TID) | ORAL | 0 refills | Status: AC | PRN
Start: 2024-04-14 — End: ?

## 2024-04-14 MED ORDER — ONDANSETRON HCL 4 MG/2ML IJ SOLN
4.0000 mg | Freq: Once | INTRAMUSCULAR | Status: AC
  Administered 2024-04-14: 4 mg via INTRAVENOUS
  Filled 2024-04-14: qty 2

## 2024-04-14 MED ORDER — SODIUM CHLORIDE 0.9 % IV BOLUS
1000.0000 mL | Freq: Once | INTRAVENOUS | Status: AC
  Administered 2024-04-14: 1000 mL via INTRAVENOUS

## 2024-04-14 NOTE — Discharge Instructions (Signed)
 Please follow-up with gastroenterology.  Please return for any new, worsening, or changing symptoms or other concerns.  Patient will stay hydrated.  You may take the zofran  as prescribed though do not take more than prescribed as we discussed.  It was a pleasure caring for you today.

## 2024-04-14 NOTE — ED Provider Notes (Signed)
 [facility] Provider Note    Event Date/Time   First MD Initiated Contact with Patient 04/14/24 0845     (approximate)   History   Abdominal Pain   HPI  Nicole Weber is a 33 y.o. female who presents today for evaluation of abdominal pain, nausea, vomiting, diarrhea for the past 5 days.  Patient reports that there is no blood in her vomit or stool.  She reports that her abdominal pain has improved significantly though she still is having 2 loose stools per day and feeling very nauseated.  She reports that she ate at [employer] the day before her symptoms began.  No fevers or chills.  No recent travel outside of [place] .  There are no active problems to display for this patient.         Physical Exam   Triage Vital Signs: ED Triage Vitals [04/14/24 0820]  Encounter Vitals Group     BP (!) 153/111     Girls Systolic BP Percentile      Girls Diastolic BP Percentile      Boys Systolic BP Percentile      Boys Diastolic BP Percentile      Pulse Rate (!) 102     Resp 18     Temp 98.1 F (36.7 C)     Temp Source Oral     SpO2 98 %     Weight 297 lb (134.7 kg)     Height 5' 8 (1.727 m)     Head Circumference      Peak Flow      Pain Score 3     Pain Loc      Pain Education      Exclude from Growth Chart     Most recent vital signs: Vitals:   04/14/24 1333 04/14/24 1425  BP: 130/79 128/82  Pulse: 94 88  Resp: 18 17  Temp: 98.1 F (36.7 C) 98 F (36.7 C)  SpO2: 100% 99%    Physical Exam Vitals and nursing note reviewed.  Constitutional:      General: Awake and alert. No acute distress.    Appearance: Normal appearance.  HENT:     Head: Normocephalic and atraumatic.     Mouth: Mucous membranes are moist.  Eyes:     General: PERRL. Normal EOMs        Right eye: No discharge.        Left eye: No discharge.     Conjunctiva/sclera: Conjunctivae normal.  Cardiovascular:     Rate and Rhythm: Normal rate and regular rhythm.      Pulses: Normal pulses.  Pulmonary:     Effort: Pulmonary effort is normal. No respiratory distress.     Breath sounds: Normal breath sounds.  Abdominal:     Abdomen is soft. There is no abdominal tenderness. No rebound or guarding. No distention. Musculoskeletal:        General: No swelling. Normal range of motion.     Cervical back: Normal range of motion and neck supple.  Skin:    General: Skin is warm and dry.     Capillary Refill: Capillary refill takes less than 2 seconds.     Findings: No rash.  Neurological:     Mental Status: The patient is awake and alert.      ED Results / Procedures / Treatments   Labs (all labs ordered are listed, but only abnormal results are displayed) Labs Reviewed  COMPREHENSIVE METABOLIC PANEL WITH  GFR - Abnormal; Notable for the following components:      Result Value   Sodium 134 (*)    CO2 21 (*)    Glucose, Bld 209 (*)    AST 85 (*)    All other components within normal limits  CBC - Abnormal; Notable for the following components:   RBC 5.25 (*)    All other components within normal limits  URINALYSIS, ROUTINE W REFLEX MICROSCOPIC - Abnormal; Notable for the following components:   Color, Urine YELLOW (*)    APPearance HAZY (*)    Glucose, UA 50 (*)    Ketones, ur 20 (*)    All other components within normal limits  GASTROINTESTINAL PANEL BY PCR, STOOL (REPLACES STOOL CULTURE)  LIPASE, BLOOD  POC URINE PREG, ED     EKG     RADIOLOGY     PROCEDURES:  Critical Care performed:   Procedures   MEDICATIONS ORDERED IN ED: Medications  sodium chloride  0.9 % bolus 1,000 mL (0 mLs Intravenous Stopped 04/14/24 1055)  ondansetron  (ZOFRAN ) injection 4 mg (4 mg Intravenous Given 04/14/24 0911)     IMPRESSION / MDM / ASSESSMENT AND PLAN / ED COURSE  I reviewed the triage vital signs and the nursing notes.   Differential diagnosis includes, but is not limited to, gastroenteritis, viral syndrome, dehydration, electrolyte  disarray.  I reviewed the patient's chart.  Patient was seen at urgent care and also in this ER on 04/10/2024.  She was diagnosed with gastritis at the urgent care but came to the emergency department for further evaluation.  She underwent right upper quadrant ultrasound which revealed hepatic steatosis only, without gallstones.  Patient is awake and alert, hemodynamically stable and afebrile.  She is nontoxic in appearance.  Labs obtained are overall reassuring.  She was treated symptomatically with IV fluids and Zofran  with good effect.  Recommended CT scan to evaluate for underlying etiology given that this is a third visit for her symptoms however she declined.  I have low suspicion for intra-abdominal pathology given her lack of abdominal tenderness on exam, her reports that her abdominal pain is improving, and her normal blood counts.  I suspect gastroenteritis.  I recommended stool sample.  Stool samples negative for any acute findings.  She has not had any recent antibiotics to suggest potential for C. difficile.  There is no blood in her stool.  Upon reevaluation, patient reports that she feels significantly improved.  She reports that this is the longest that she has gone without having any nausea, vomiting, or diarrhea and she is feeling very optimistic upon reevaluation.  Discussed other possibilities including viral gastroenteritis, irritable bowel syndrome, and I recommended outpatient follow-up with gastroenterology.  She reports that she has already received a referral for GI.  She has no abdominal pain at this time, no tenderness on exam, and continues to defer CT imaging which I feel is reasonable.  We discussed return precautions and outpatient follow-up.  She requested a refill of the Zofran  in case her symptoms return, which was provided.  She was advised not to take more than prescribed given risk of QTc prolongation, patient understands and agrees.  She also requests a work note which was  provided.  Patient understands and agrees with plan.  She was discharged in stable condition.  Patient's presentation is most consistent with acute complicated illness / injury requiring diagnostic workup.   Clinical Course as of 04/14/24 1432  Wed Apr 14, 2024  1427 Upon  reevaluation, patient reports that she feels significantly improved.  She reports that this is the longest that she has gone without having nausea.  She feels ready for discharge home [JP]    Clinical Course User Index [JP] Donja Tipping E, PA-C     FINAL CLINICAL IMPRESSION(S) / ED DIAGNOSES   Final diagnoses:  Diarrhea, unspecified type     Rx / DC Orders   ED Discharge Orders          Ordered    ondansetron  (ZOFRAN -ODT) 4 MG disintegrating tablet  Every 8 hours PRN        04/14/24 1418             Note:  This document was prepared using Dragon voice recognition software and may include unintentional dictation errors.   Holt Woolbright E, PA-C 04/14/24 1432    Jacolyn Pae, MD 04/14/24 1441

## 2024-04-14 NOTE — ED Triage Notes (Signed)
 Patient states abdominal pain, N/V/D since 04/09/2024; was seen here and discharged home on 04/10/2024 for stomach flu.
